# Patient Record
Sex: Male | Born: 1980 | Race: White | Hispanic: No | Smoking: Current every day smoker
Health system: Southern US, Community
[De-identification: ages and names within clinical notes are randomized; demographics above are authoritative.]

## PROBLEM LIST (undated history)

## (undated) DIAGNOSIS — F329 Major depressive disorder, single episode, unspecified: Secondary | ICD-10-CM

## (undated) DIAGNOSIS — F32A Depression, unspecified: Secondary | ICD-10-CM

## (undated) HISTORY — PX: EYE SURGERY: SHX253

---
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
## 2003-01-29 ENCOUNTER — Emergency Department (HOSPITAL_COMMUNITY): Admission: EM | Admit: 2003-01-29 | Discharge: 2003-01-29 | Payer: Self-pay | Admitting: *Deleted

## 2004-06-15 ENCOUNTER — Emergency Department (HOSPITAL_COMMUNITY): Admission: EM | Admit: 2004-06-15 | Discharge: 2004-06-15 | Payer: Self-pay | Admitting: Family Medicine

## 2004-06-25 ENCOUNTER — Emergency Department (HOSPITAL_COMMUNITY): Admission: EM | Admit: 2004-06-25 | Discharge: 2004-06-25 | Payer: Self-pay | Admitting: Family Medicine

## 2009-08-15 ENCOUNTER — Observation Stay (HOSPITAL_COMMUNITY): Admission: EM | Admit: 2009-08-15 | Discharge: 2009-08-16 | Payer: Self-pay | Admitting: Emergency Medicine

## 2010-09-29 LAB — CBC
HCT: 47.9 % (ref 39.0–52.0)
Hemoglobin: 13.8 g/dL (ref 13.0–17.0)
MCHC: 34.6 g/dL (ref 30.0–36.0)
MCHC: 34.9 g/dL (ref 30.0–36.0)
Platelets: 179 10*3/uL (ref 150–400)
RBC: 4.95 MIL/uL (ref 4.22–5.81)
RDW: 13.5 % (ref 11.5–15.5)
WBC: 6.2 10*3/uL (ref 4.0–10.5)
WBC: 8.3 10*3/uL (ref 4.0–10.5)

## 2010-09-29 LAB — BASIC METABOLIC PANEL
BUN: 7 mg/dL (ref 6–23)
CO2: 30 mEq/L (ref 19–32)
Calcium: 8.1 mg/dL — ABNORMAL LOW (ref 8.4–10.5)
GFR calc non Af Amer: >60 mL/min (ref >60)
Glucose, Bld: 108 mg/dL — ABNORMAL HIGH (ref 70–99)
Potassium: 3.7 mEq/L (ref 3.5–5.1)

## 2010-09-29 LAB — COMPREHENSIVE METABOLIC PANEL
ALT: 27 U/L (ref 0–53)
Albumin: 4.5 g/dL (ref 3.5–5.2)
Alkaline Phosphatase: 99 U/L (ref 39–117)
BUN: 6 mg/dL (ref 6–23)
CO2: 31 mEq/L (ref 19–32)
GFR calc Af Amer: >60 mL/min (ref >60)
GFR calc non Af Amer: >60 mL/min (ref >60)
Sodium: 141 mEq/L (ref 135–145)

## 2010-09-29 LAB — DIFFERENTIAL
Basophils Absolute: 0 10*3/uL (ref 0.0–0.1)
Basophils Relative: 0 % (ref 0–1)
Eosinophils Absolute: 0 10*3/uL (ref 0.0–0.7)
Eosinophils Relative: 0 % (ref 0–5)
Lymphocytes Relative: 10 % — ABNORMAL LOW (ref 12–46)
Monocytes Relative: 6 % (ref 3–12)
Neutro Abs: 6.9 10*3/uL (ref 1.7–7.7)

## 2010-09-29 LAB — RAPID URINE DRUG SCREEN, HOSP PERFORMED
Barbiturates: NOT DETECTED
Benzodiazepines: NOT DETECTED
Cocaine: NOT DETECTED
Opiates: NOT DETECTED
Tetrahydrocannabinol: POSITIVE — AB

## 2010-09-29 LAB — GLUCOSE, CAPILLARY: Glucose-Capillary: 113 mg/dL — ABNORMAL HIGH (ref 70–99)
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
## 2012-03-14 ENCOUNTER — Emergency Department (HOSPITAL_COMMUNITY)
Admission: EM | Admit: 2012-03-14 | Discharge: 2012-03-14 | Disposition: A | Discharge status: Home / Self Care | Payer: Self-pay | Attending: Emergency Medicine | Admitting: Emergency Medicine

## 2012-03-14 ENCOUNTER — Encounter (HOSPITAL_COMMUNITY): Payer: Self-pay | Admitting: Emergency Medicine

## 2012-03-14 DIAGNOSIS — E860 Dehydration: Secondary | ICD-10-CM

## 2012-03-14 DIAGNOSIS — F3289 Other specified depressive episodes: Secondary | ICD-10-CM | POA: Insufficient documentation

## 2012-03-14 DIAGNOSIS — F17200 Nicotine dependence, unspecified, uncomplicated: Secondary | ICD-10-CM | POA: Insufficient documentation

## 2012-03-14 DIAGNOSIS — R112 Nausea with vomiting, unspecified: Secondary | ICD-10-CM

## 2012-03-14 DIAGNOSIS — F329 Major depressive disorder, single episode, unspecified: Secondary | ICD-10-CM | POA: Insufficient documentation

## 2012-03-14 HISTORY — DX: Depression, unspecified: F32.A

## 2012-03-14 HISTORY — DX: Major depressive disorder, single episode, unspecified: F32.9

## 2012-03-14 LAB — CBC WITH DIFFERENTIAL/PLATELET
Eosinophils Relative: 0 % (ref 0–5)
HCT: 44.4 % (ref 39.0–52.0)
MCH: 33.2 pg (ref 26.0–34.0)
MCV: 89.9 fL (ref 78.0–100.0)
Monocytes Absolute: 0.2 10*3/uL (ref 0.1–1.0)
Neutro Abs: 3.3 10*3/uL (ref 1.7–7.7)
Platelets: 195 10*3/uL (ref 150–400)
RDW: 12.4 % (ref 11.5–15.5)
WBC: 4.2 10*3/uL (ref 4.0–10.5)

## 2012-03-14 LAB — COMPREHENSIVE METABOLIC PANEL
ALT: 67 U/L — ABNORMAL HIGH (ref 0–53)
AST: 51 U/L — ABNORMAL HIGH (ref 0–37)
Albumin: 4.6 g/dL (ref 3.5–5.2)
Calcium: 9.6 mg/dL (ref 8.4–10.5)
Total Protein: 7.9 g/dL (ref 6.0–8.3)

## 2012-03-14 LAB — RAPID URINE DRUG SCREEN, HOSP PERFORMED
Amphetamines: NOT DETECTED
Barbiturates: NOT DETECTED
Cocaine: NOT DETECTED
Opiates: NOT DETECTED

## 2012-03-14 LAB — ETHANOL: Alcohol, Ethyl (B): 67 mg/dL — ABNORMAL HIGH (ref 0–11)

## 2012-03-14 MED ORDER — ALPRAZOLAM 0.25 MG PO TABS: 0.2500 mg | ORAL_TABLET | Freq: Three times a day (TID) | ORAL | Status: DC | PRN

## 2012-03-14 MED ORDER — SODIUM CHLORIDE 0.9 % IV SOLN: INTRAVENOUS | Status: DC

## 2012-03-14 MED ORDER — SODIUM CHLORIDE 0.9 % IV BOLUS (SEPSIS)
1000.0000 mL | Freq: Once | INTRAVENOUS | Status: AC
  Administered 2012-03-14: 1000 mL via INTRAVENOUS

## 2012-03-14 MED ORDER — ONDANSETRON HCL 4 MG PO TABS: 4.0000 mg | ORAL_TABLET | Freq: Four times a day (QID) | ORAL | Status: AC

## 2012-03-14 MED ORDER — ONDANSETRON 4 MG PO TBDP
ORAL_TABLET | ORAL | Status: AC
  Filled 2012-03-14: qty 2

## 2012-03-14 MED ORDER — ONDANSETRON 4 MG PO TBDP
8.0000 mg | ORAL_TABLET | Freq: Once | ORAL | Status: AC
  Administered 2012-03-14: 8 mg via ORAL

## 2012-03-14 MED ORDER — GI COCKTAIL ~~LOC~~
30.0000 mL | Freq: Once | ORAL | Status: AC
  Administered 2012-03-14: 30 mL via ORAL
  Filled 2012-03-14: qty 30

## 2012-03-14 MED ORDER — FLUOXETINE HCL 20 MG PO TABS: 20.0000 mg | ORAL_TABLET | Freq: Every day | ORAL | Status: AC

## 2012-03-14 NOTE — ED Notes (Signed)
Having w/drawal from prozac nausea  Having SI he states

## 2012-03-14 NOTE — ED Notes (Signed)
Pt changed to scrubs 

## 2012-03-14 NOTE — ED Notes (Signed)
Sitter called for security called for and pt wanded labs drawn and pt ask to void

## 2012-03-14 NOTE — ED Notes (Signed)
Pt comes to [facility]  To receive iv fluids then to be discharged home. He states only some nausea. But really needs to get back on his prozac. Pt agreeable with plan.

## 2012-03-14 NOTE — ED Provider Notes (Signed)
Patient feels much better after IV fluids. He is tolerating PO. No pain or nausea. Plan as reported by Dr. Freida Busman was to discharge home with Prozac and Xanax prescriptions. [facility]  Rodena Medin, PA-C 03/14/12 1658

## 2012-03-14 NOTE — ED Provider Notes (Signed)
Need IVF, and refill for prozac 20mg  PO once daily. zanax 0.25 TID PRN, f/u to [place].  Report given to Caesar Bookman who will d/c pt after 2 liter of fluid.    Fayrene Helper, PA-C 03/14/12 1527

## 2012-03-14 NOTE — ED Provider Notes (Signed)
History     CSN: [id number]  Arrival date & time 03/14/12  1237   First MD Initiated Contact with Patient 03/14/12 1417      Chief Complaint  Patient presents with  . Medical Clearance    (Consider location/radiation/quality/duration/timing/severity/associated sxs/prior treatment) The history is provided by the patient.   Patient here complaining of nausea vomiting since stopping his Prozac 5 days ago. Normally takes Prozac 20 mg a day but ran out of his medications. Has been having suicidal thoughts without an actual plan and denies that he would go home and harm himself and if he leaves here. Denies any abdominal pain or fever. No visual or auditory hallucinations. Denies any current ingestions at this time. Follows up at the [facility] Past Medical History  Diagnosis Date  . Depression     No past surgical history on file.  No family history on file.  History  Substance Use Topics  . Smoking status: Current Everyday Smoker  . Smokeless tobacco: Not on file  . Alcohol Use: Yes      Review of Systems  All other systems reviewed and are negative.    Allergies  Review of patient's allergies indicates no known allergies.  Home Medications   Current Outpatient Rx  Name Route Sig Dispense Refill  . FLUOXETINE HCL 20 MG PO CAPS Oral Take 20 mg by mouth daily.    Marland Kitchen GABAPENTIN 100 MG PO CAPS Oral Take 100 mg by mouth 5 (five) times daily as needed. For pain      BP 163/111  Pulse 78  Temp 98.5 F (36.9 C) (Oral)  Resp 16  SpO2 98%  Physical Exam  Nursing note and vitals reviewed. Constitutional: He is oriented to person, place, and time. He appears well-developed and well-nourished.  Non-toxic appearance. No distress.  HENT:  Head: Normocephalic and atraumatic.  Eyes: Conjunctivae, EOM and lids are normal. Pupils are equal, round, and reactive to light.  Neck: Normal range of motion. Neck supple. No tracheal deviation present. No mass present.  Cardiovascular: Normal  rate, regular rhythm and normal heart sounds.  Exam reveals no gallop.   No murmur heard. Pulmonary/Chest: Effort normal and breath sounds normal. No stridor. No respiratory distress. He has no decreased breath sounds. He has no wheezes. He has no rhonchi. He has no rales.  Abdominal: Soft. Normal appearance and bowel sounds are normal. He exhibits no distension. There is no tenderness. There is no rebound and no CVA tenderness.  Musculoskeletal: Normal range of motion. He exhibits no edema and no tenderness.  Neurological: He is alert and oriented to person, place, and time. He has normal strength. No cranial nerve deficit or sensory deficit. GCS eye subscore is 4. GCS verbal subscore is 5. GCS motor subscore is 6.  Skin: Skin is warm and dry. No abrasion and no rash noted.  Psychiatric: His speech is normal and behavior is normal. His mood appears anxious. He expresses suicidal ideation. He expresses no suicidal plans.    ED Course  Procedures (including critical care time)  Labs Reviewed  ETHANOL - Abnormal; Notable for the following:    Alcohol, Ethyl (B) 67 (*)     All other components within normal limits  URINE RAPID DRUG SCREEN (HOSP PERFORMED) - Abnormal; Notable for the following:    Tetrahydrocannabinol POSITIVE (*)     All other components within normal limits  COMPREHENSIVE METABOLIC PANEL - Abnormal; Notable for the following:    Glucose, Bld 121 (*)  AST 51 (*)     ALT 67 (*)     All other components within normal limits  CBC WITH DIFFERENTIAL - Abnormal; Notable for the following:    MCHC 36.9 (*)     Neutrophils Relative 78 (*)     All other components within normal limits   No results found.   No diagnosis found.    MDM  Patient without signs of acute abdominal process. Will be given IV fluids here and prescription for Prozac and a short course of Xanax and patient to followup at the [facility]        Toy Baker, MD 03/14/12 1437

## 2012-03-19 NOTE — ED Provider Notes (Signed)
Medical screening examination/treatment/procedure(s) were conducted as a shared visit with non-physician practitioner(s) and myself.  I personally evaluated the patient during the encounter  Yoandri Congrove T Deashia Soule, MD 03/19/12 0750 

## 2012-03-19 NOTE — ED Provider Notes (Signed)
Medical screening examination/treatment/procedure(s) were conducted as a shared visit with non-physician practitioner(s) and myself.  I personally evaluated the patient during the encounter  Tyus Kallam T Jcion Buddenhagen, MD 03/19/12 0750 

## 2012-03-30 ENCOUNTER — Emergency Department (HOSPITAL_COMMUNITY)
Admission: EM | Admit: 2012-03-30 | Discharge: 2012-03-30 | Disposition: A | Discharge status: Home / Self Care | Payer: Self-pay | Attending: Emergency Medicine | Admitting: Emergency Medicine

## 2012-03-30 ENCOUNTER — Encounter (HOSPITAL_COMMUNITY): Payer: Self-pay | Admitting: Emergency Medicine

## 2012-03-30 DIAGNOSIS — S21209A Unspecified open wound of unspecified back wall of thorax without penetration into thoracic cavity, initial encounter: Secondary | ICD-10-CM | POA: Insufficient documentation

## 2012-03-30 DIAGNOSIS — W268XXA Contact with other sharp object(s), not elsewhere classified, initial encounter: Secondary | ICD-10-CM | POA: Insufficient documentation

## 2012-03-30 DIAGNOSIS — L0390 Cellulitis, unspecified: Secondary | ICD-10-CM

## 2012-03-30 DIAGNOSIS — W01119A Fall on same level from slipping, tripping and stumbling with subsequent striking against unspecified sharp object, initial encounter: Secondary | ICD-10-CM | POA: Insufficient documentation

## 2012-03-30 DIAGNOSIS — T07XXXA Unspecified multiple injuries, initial encounter: Secondary | ICD-10-CM

## 2012-03-30 DIAGNOSIS — S31809A Unspecified open wound of unspecified buttock, initial encounter: Secondary | ICD-10-CM | POA: Insufficient documentation

## 2012-03-30 LAB — COMPREHENSIVE METABOLIC PANEL
ALT: 45 U/L (ref 0–53)
Albumin: 3.9 g/dL (ref 3.5–5.2)
Alkaline Phosphatase: 86 U/L (ref 39–117)
BUN: 9 mg/dL (ref 6–23)
Chloride: 101 mEq/L (ref 96–112)
GFR calc Af Amer: >90 mL/min (ref >90)
Glucose, Bld: 104 mg/dL — ABNORMAL HIGH (ref 70–99)
Potassium: 3.9 mEq/L (ref 3.5–5.1)
Sodium: 137 mEq/L (ref 135–145)
Total Bilirubin: 0.2 mg/dL — ABNORMAL LOW (ref 0.3–1.2)
Total Protein: 7.4 g/dL (ref 6.0–8.3)

## 2012-03-30 LAB — CBC WITH DIFFERENTIAL/PLATELET
Eosinophils Absolute: 0 10*3/uL (ref 0.0–0.7)
Hemoglobin: 15.4 g/dL (ref 13.0–17.0)
Lymphs Abs: 0.9 10*3/uL (ref 0.7–4.0)
MCH: 32.6 pg (ref 26.0–34.0)
Monocytes Relative: 7 % (ref 3–12)
Neutro Abs: 6 10*3/uL (ref 1.7–7.7)
Neutrophils Relative %: 81 % — ABNORMAL HIGH (ref 43–77)
Platelets: 261 10*3/uL (ref 150–400)
RBC: 4.73 MIL/uL (ref 4.22–5.81)
WBC: 7.4 10*3/uL (ref 4.0–10.5)

## 2012-03-30 LAB — RAPID URINE DRUG SCREEN, HOSP PERFORMED
Barbiturates: NOT DETECTED
Benzodiazepines: NOT DETECTED
Cocaine: NOT DETECTED
Tetrahydrocannabinol: POSITIVE — AB

## 2012-03-30 LAB — SALICYLATE LEVEL: Salicylate Lvl: <2 mg/dL — ABNORMAL LOW (ref 2.8–20.0)

## 2012-03-30 LAB — ETHANOL: Alcohol, Ethyl (B): 25 mg/dL — ABNORMAL HIGH (ref 0–11)

## 2012-03-30 MED ORDER — SODIUM CHLORIDE 0.9 % IV BOLUS (SEPSIS)
1000.0000 mL | Freq: Once | INTRAVENOUS | Status: AC
  Administered 2012-03-30: 1000 mL via INTRAVENOUS

## 2012-03-30 MED ORDER — CLINDAMYCIN HCL 150 MG PO CAPS: 150.0000 mg | ORAL_CAPSULE | Freq: Four times a day (QID) | ORAL | Status: DC

## 2012-03-30 MED ORDER — ONDANSETRON HCL 4 MG/2ML IJ SOLN
4.0000 mg | Freq: Once | INTRAMUSCULAR | Status: AC
  Administered 2012-03-30: 4 mg via INTRAVENOUS
  Filled 2012-03-30: qty 2

## 2012-03-30 MED ORDER — ACETAMINOPHEN 325 MG PO TABS
650.0000 mg | ORAL_TABLET | Freq: Once | ORAL | Status: AC
  Administered 2012-03-30: 650 mg via ORAL

## 2012-03-30 MED ORDER — ACETAMINOPHEN 500 MG PO TABS: 500.0000 mg | ORAL_TABLET | Freq: Four times a day (QID) | ORAL | Status: DC | PRN

## 2012-03-30 MED ORDER — CLINDAMYCIN PHOSPHATE 600 MG/50ML IV SOLN
600.0000 mg | Freq: Once | INTRAVENOUS | Status: AC
  Administered 2012-03-30: 600 mg via INTRAVENOUS
  Filled 2012-03-30: qty 50

## 2012-03-30 MED ORDER — ACETAMINOPHEN 325 MG PO TABS
ORAL_TABLET | ORAL | Status: AC
  Administered 2012-03-30: 650 mg via ORAL
  Filled 2012-03-30: qty 2

## 2012-03-30 NOTE — ED Notes (Signed)
Pt. Wound cleaned up with saline on (2) 4x4. Pt. Wound dryed and (2) 4x4 applied to back with tape.
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
## 2012-03-30 NOTE — ED Notes (Signed)
Bedside report received from previous RN 
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
## 2012-03-31 NOTE — ED Provider Notes (Signed)
Medical screening examination/treatment/procedure(s) were performed by non-physician practitioner and as supervising physician I was immediately available for consultation/collaboration.   Claude Swendsen III, MD 03/31/12 1334 

## 2014-04-26 ENCOUNTER — Encounter (HOSPITAL_COMMUNITY): Payer: Self-pay | Admitting: Emergency Medicine

## 2014-04-26 ENCOUNTER — Emergency Department (INDEPENDENT_AMBULATORY_CARE_PROVIDER_SITE_OTHER)
Admission: EM | Admit: 2014-04-26 | Discharge: 2014-04-26 | Disposition: A | Discharge status: Home / Self Care | Payer: Self-pay | Source: Home / Self Care | Attending: Family Medicine | Admitting: Family Medicine

## 2014-04-26 DIAGNOSIS — K0889 Other specified disorders of teeth and supporting structures: Secondary | ICD-10-CM

## 2014-04-26 DIAGNOSIS — K088 Other specified disorders of teeth and supporting structures: Secondary | ICD-10-CM

## 2014-04-26 MED ORDER — AMOXICILLIN 500 MG PO CAPS: 500.0000 mg | ORAL_CAPSULE | Freq: Three times a day (TID) | ORAL | Status: DC

## 2014-04-26 MED ORDER — HYDROCODONE-ACETAMINOPHEN 5-325 MG PO TABS: 1.0000 | ORAL_TABLET | Freq: Four times a day (QID) | ORAL | Status: DC | PRN

## 2014-04-26 MED ORDER — BUPIVACAINE-EPINEPHRINE (PF) 0.5% -1:200000 IJ SOLN
INTRAMUSCULAR | Status: AC
  Filled 2014-04-26: qty 1.8

## 2014-04-26 NOTE — Discharge Instructions (Signed)
Thank you for coming in today. Take amoxicillin 3 times daily for one week. Use Norco for severe pain. Followup with a dentist. Please call Dr. Lawrence Marseillesivils office [phone] or cell [phone] [address], [place]  Cost for tooth removal $200 includes exam, Xray, and extraction and follow up visit.  Bring list of current medications with you.    Dental Pain A tooth ache may be caused by cavities (tooth decay). Cavities expose the nerve of the tooth to air and hot or cold temperatures. It may come from an infection or abscess (also called a boil or furuncle) around your tooth. It is also often caused by dental caries (tooth decay). This causes the pain you are having. DIAGNOSIS  Your caregiver can diagnose this problem by exam. TREATMENT   If caused by an infection, it may be treated with medications which kill germs (antibiotics) and pain medications as prescribed by your caregiver. Take medications as directed.  Only take over-the-counter or prescription medicines for pain, discomfort, or fever as directed by your caregiver.  Whether the tooth ache today is caused by infection or dental disease, you should see your dentist as soon as possible for further care. SEEK MEDICAL CARE IF: The exam and treatment you received today has been provided on an emergency basis only. This is not a substitute for complete medical or dental care. If your problem worsens or new problems (symptoms) appear, and you are unable to meet with your dentist, call or return to this location. SEEK IMMEDIATE MEDICAL CARE IF:   You have a fever.  You develop redness and swelling of your face, jaw, or neck.  You are unable to open your mouth.  You have severe pain uncontrolled by pain medicine. MAKE SURE YOU:   Understand these instructions.  Will watch your condition.  Will get help right away if you are not doing well or get worse. Document Released: 06/26/2005 Document Revised: 09/18/2011  Document Reviewed: 02/12/2008 [facility] Patient Information 2015 [employer] [place]. This information is not intended to replace advice given to you by your health care provider. Make sure you discuss any questions you have with your health care provider.

## 2014-04-26 NOTE — ED Notes (Signed)
See provider's note

## 2014-04-26 NOTE — ED Provider Notes (Signed)
James Newton is a 33 y.o. male who presents to Urgent Care today for dental pain. Patient has pain in his upper right mouth. He suffered a fractured tooth several weeks ago and had worsening pain starting yesterday. The pain is severe. He has tried over-the-counter medications which have not helped. Pain is worse with chewing. No fevers or chills nausea vomiting or diarrhea.   Past Medical History  Diagnosis Date  . Depression    History  Substance Use Topics  . Smoking status: Current Every Day Smoker  . Smokeless tobacco: Not on file  . Alcohol Use: Yes   ROS as above Medications: No current facility-administered medications for this encounter.   Current Outpatient Prescriptions  Medication Sig Dispense Refill  . acetaminophen (TYLENOL) 500 MG tablet Take 1 tablet (500 mg total) by mouth every 6 (six) hours as needed for pain.  30 tablet  0  . amoxicillin (AMOXIL) 500 MG capsule Take 1 capsule (500 mg total) by mouth 3 (three) times daily.  21 capsule  0  . FLUoxetine (PROZAC) 20 MG tablet Take 1 tablet (20 mg total) by mouth daily.  30 tablet  0  . gabapentin (NEURONTIN) 100 MG capsule Take 100 mg by mouth 5 (five) times daily as needed. For pain      . HYDROcodone-acetaminophen (NORCO/VICODIN) 5-325 MG per tablet Take 1 tablet by mouth every 6 (six) hours as needed.  15 tablet  0    Exam:  BP 158/109  Pulse 93  Temp(Src) 98.5 F (36.9 C) (Oral)  Resp 18  SpO2 98% Gen: Well NAD Dental: Multiple filled cavities. Upper right molar broken and tender with erythematous gumline without abscess.  Dental injection: Consent obtained Topical numbing medicine applied to the base of the tooth 1.8 mL of Marcaine and epinephrine were injected into the base of the tooth at the junction of the gum and cheek achieving good anesthesia. Patient tolerated procedure well.   No results found for this or any previous visit (from the past 24 hour(s)). No results found.  Assessment and  Plan: [age] y.o. male with dental pain. Status post injection. Treatment with amoxicillin and Norco. Followup with dentist.  Discussed warning signs or symptoms. Please see discharge instructions. Patient expresses understanding.     Rodolph BongEvan S Zedekiah Hinderman, MD 04/26/14 [phone]

## 2015-03-26 ENCOUNTER — Encounter (HOSPITAL_COMMUNITY): Payer: Self-pay | Admitting: Emergency Medicine

## 2015-03-26 ENCOUNTER — Emergency Department (INDEPENDENT_AMBULATORY_CARE_PROVIDER_SITE_OTHER)
Admission: EM | Admit: 2015-03-26 | Discharge: 2015-03-26 | Disposition: A | Discharge status: Home / Self Care | Payer: Self-pay | Source: Home / Self Care | Attending: Family Medicine | Admitting: Family Medicine

## 2015-03-26 DIAGNOSIS — S0181XA Laceration without foreign body of other part of head, initial encounter: Secondary | ICD-10-CM

## 2015-03-26 DIAGNOSIS — Z23 Encounter for immunization: Secondary | ICD-10-CM

## 2015-03-26 MED ORDER — AMOXICILLIN-POT CLAVULANATE 875-125 MG PO TABS: 1.0000 | ORAL_TABLET | Freq: Two times a day (BID) | ORAL | Status: AC

## 2015-03-26 MED ORDER — LIDOCAINE-EPINEPHRINE (PF) 2 %-1:200000 IJ SOLN
INTRAMUSCULAR | Status: AC
  Filled 2015-03-26: qty 20

## 2015-03-26 MED ORDER — PERMETHRIN 0.25 % LIQD
Status: AC
  Filled 2015-03-26: qty 147.86

## 2015-03-26 MED ORDER — TETANUS-DIPHTH-ACELL PERTUSSIS 5-2.5-18.5 LF-MCG/0.5 IM SUSP
0.5000 mL | Freq: Once | INTRAMUSCULAR | Status: AC
  Administered 2015-03-26: 0.5 mL via INTRAMUSCULAR

## 2015-03-26 MED ORDER — TETANUS-DIPHTH-ACELL PERTUSSIS 5-2.5-18.5 LF-MCG/0.5 IM SUSP
INTRAMUSCULAR | Status: AC
  Filled 2015-03-26: qty 0.5
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
## 2015-03-26 NOTE — ED Provider Notes (Addendum)
CSN: [id number]     Arrival date & time 03/26/15  1746 History   None    Chief Complaint  Patient presents with  . Facial Laceration   (Consider location/radiation/quality/duration/timing/severity/associated sxs/prior Treatment) HPI   Sleeping on couch. Pulled on blanket in the middle of the night. Patient states that appointment would not move from underneath him and so he got very frustrated and start playing the blanket very firmly on the blanket finally did get out his fistula towards his face very quickly until he struck himself on the left cheek bone. This caused a laceration. Patient states that he applied superglue with resolution of the bleeding low back to bed. Patient states he applied make a good air 2 days to see if he could go to work but was told he could not. He occurred approximately 16 hours ago.   Unsure  last tetanus. Denies headache, neck stiffness, fevers, purulent discharge.   Past Medical History  Diagnosis Date  . Depression    Past Surgical History  Procedure Laterality Date  . Eye surgery     No family history on file. Social History  Substance Use Topics  . Smoking status: Current Every Day Smoker  . Smokeless tobacco: None  . Alcohol Use: Yes    Review of Systems  Allergies  Review of patient's allergies indicates no known allergies.  Home Medications   Prior to Admission medications   Medication Sig Start Date End Date Taking? Authorizing Provider  amoxicillin-clavulanate (AUGMENTIN) 875-125 MG per tablet Take 1 tablet by mouth 2 (two) times daily. 03/26/15   Ozella Rocks, MD  FLUoxetine (PROZAC) 20 MG tablet Take 1 tablet (20 mg total) by mouth daily. 03/14/12 06/12/12  Elpidio Anis, PA-C   Meds Ordered and Administered this Visit  Medications - No data to display  BP 168/109 mmHg  Pulse 73  Temp(Src) 99.4 F (37.4 C) (Oral)  Resp 18  SpO2 100% No data found.   Physical Exam Physical Exam  Constitutional: oriented to person,  place, and time. appears well-developed and well-nourished. No distress.  HENT:  Head: Normocephalic and atraumatic.  Eyes: EOMI. PERRL.  Neck: Normal range of motion.  Cardiovascular: RRR, no m/r/g, 2+ distal pulses,  Pulmonary/Chest: Effort normal and breath sounds normal. No respiratory distress.  Abdominal: Soft. Bowel sounds are normal. NonTTP, no distension.  Musculoskeletal: Normal range of motion. Non ttp, no effusion.  Neurological: alert and oriented to person, place, and time.  Skin: Left supraoral oblique 2.1 cm laceration. Laceration extends mostly through the epidermis and in some areas through the dermis. Central stalk-like connection was pink and healthy but the edges of the wound are dark/purplish with evidence of early tissue death.  Psychiatric: normal mood and affect. behavior is normal. Judgment and thought content normal.   ED Course  LACERATION REPAIR Date/Time: 03/26/2015 7:49 PM Performed by: Konrad Dolores, DAVID J Authorized by: Konrad Dolores, DAVID J Consent: Verbal consent obtained. Risks and benefits: risks, benefits and alternatives were discussed Consent given by: patient Patient identity confirmed: verbally with patient Location: L face above the mouth and lateral to the nose. Tendon involvement: none Nerve involvement: none Vascular damage: no Anesthesia: local infiltration Local anesthetic: lidocaine 2% with epinephrine Anesthetic total: 2 ml Patient sedated: no Irrigation solution: Betadine and saline. Amount of cleaning: standard Debridement: none Degree of undermining: none Skin closure: 6-0 nylon Number of sutures: 8 Technique: simple Approximation: close Approximation difficulty: simple Patient tolerance: Patient tolerated the procedure well with no immediate complications   (  including critical care time)  Labs Review Labs Reviewed - No data to display  Imaging Review No results found.   Visual Acuity Review  Right Eye Distance:   Left  Eye Distance:   Bilateral Distance:    Right Eye Near:   Left Eye Near:    Bilateral Near:         MDM   1. Facial laceration, initial encounter    *Laceration repair as above. 3 days of Augmentin, tetanus booster given in our clinic. Return in 6 days for suture removal. Wound care instructions given. Suspect a fair amount of scarring as patient first tried applying superglue and then waited so long to come in and have his laceration repaired.  Elevated BP noted. Pt to f/u w/ PCP  Ozella Rocks, MD 03/26/15 1951  Ozella Rocks, MD 03/26/15 [phone]

## 2015-03-26 NOTE — Discharge Instructions (Signed)
The laceration to your face was repaired. Please apply anabolic ointment to the area daily. Please wash and keep area clean as you would with the rest of your body. Please come back on September 22/Thursday to have sutures removed. Our clinic is open from 1-8. Please come back sooner. Develop any others signs or symptoms of infection.
